# Patient Record
Sex: Male | Born: 1970 | Race: Black or African American | Hispanic: No | Marital: Married | Smoking: Never smoker
Health system: Southern US, Community
[De-identification: ages and names within clinical notes are randomized; demographics above are authoritative.]

## PROBLEM LIST (undated history)

## (undated) DIAGNOSIS — I10 Essential (primary) hypertension: Secondary | ICD-10-CM

## (undated) HISTORY — DX: Hypercalcemia: E83.52

---

## 1998-07-11 ENCOUNTER — Emergency Department (HOSPITAL_COMMUNITY): Admission: EM | Admit: 1998-07-11 | Discharge: 1998-07-11 | Payer: Self-pay | Admitting: Emergency Medicine

## 1999-03-18 ENCOUNTER — Emergency Department (HOSPITAL_COMMUNITY): Admission: EM | Admit: 1999-03-18 | Discharge: 1999-03-18 | Payer: Self-pay | Admitting: Emergency Medicine

## 2000-12-01 ENCOUNTER — Emergency Department (HOSPITAL_COMMUNITY): Admission: EM | Admit: 2000-12-01 | Discharge: 2000-12-02 | Payer: Self-pay | Admitting: Emergency Medicine

## 2003-02-23 ENCOUNTER — Emergency Department (HOSPITAL_COMMUNITY): Admission: EM | Admit: 2003-02-23 | Discharge: 2003-02-23 | Payer: Self-pay | Admitting: Emergency Medicine

## 2003-12-20 ENCOUNTER — Emergency Department (HOSPITAL_COMMUNITY): Admission: EM | Admit: 2003-12-20 | Discharge: 2003-12-20 | Payer: Self-pay | Admitting: Emergency Medicine

## 2003-12-20 IMAGING — CR DG CHEST 2V
2 series · 2 of 2 positions shown · non-contrast
Comparison: none

CLINICAL DATA: Chest pain and dyspnea.
 TWO VIEW CHEST ? [DATE]:
 No comparison films available.

[view not recorded (1 of 2)]
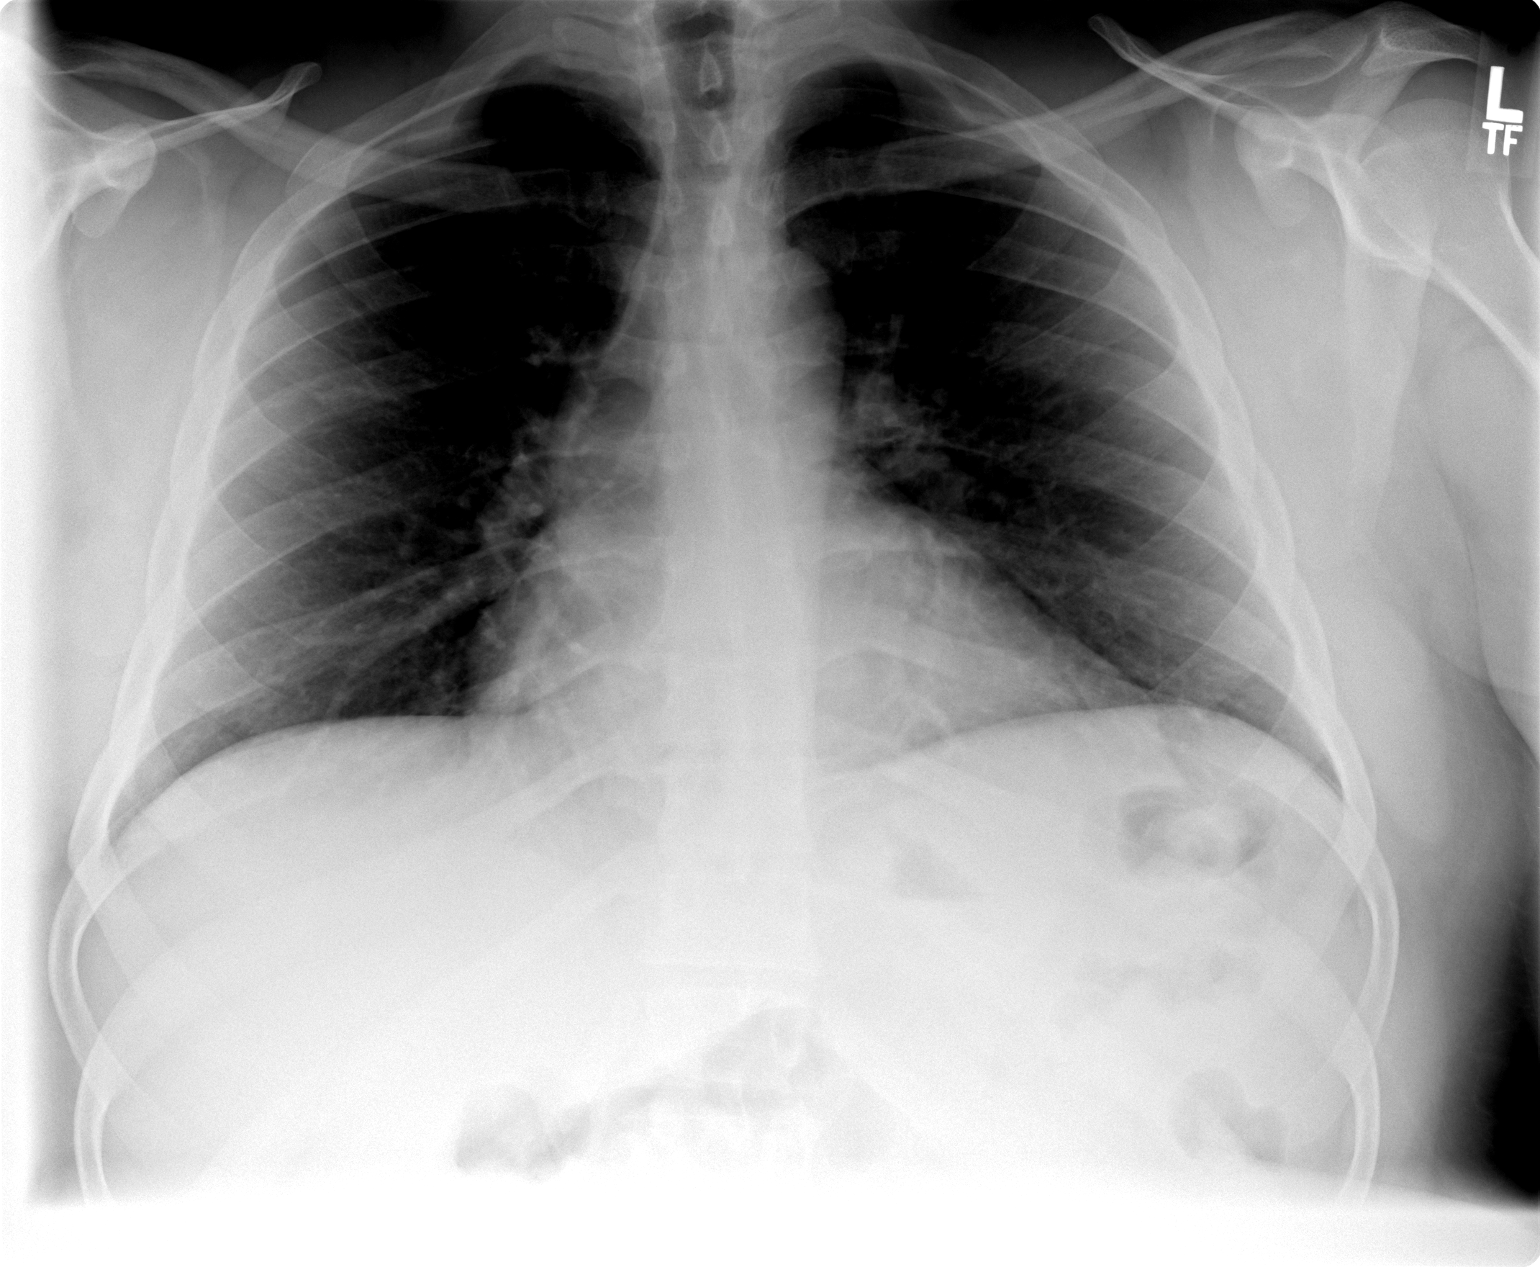

[view not recorded (2 of 2)]
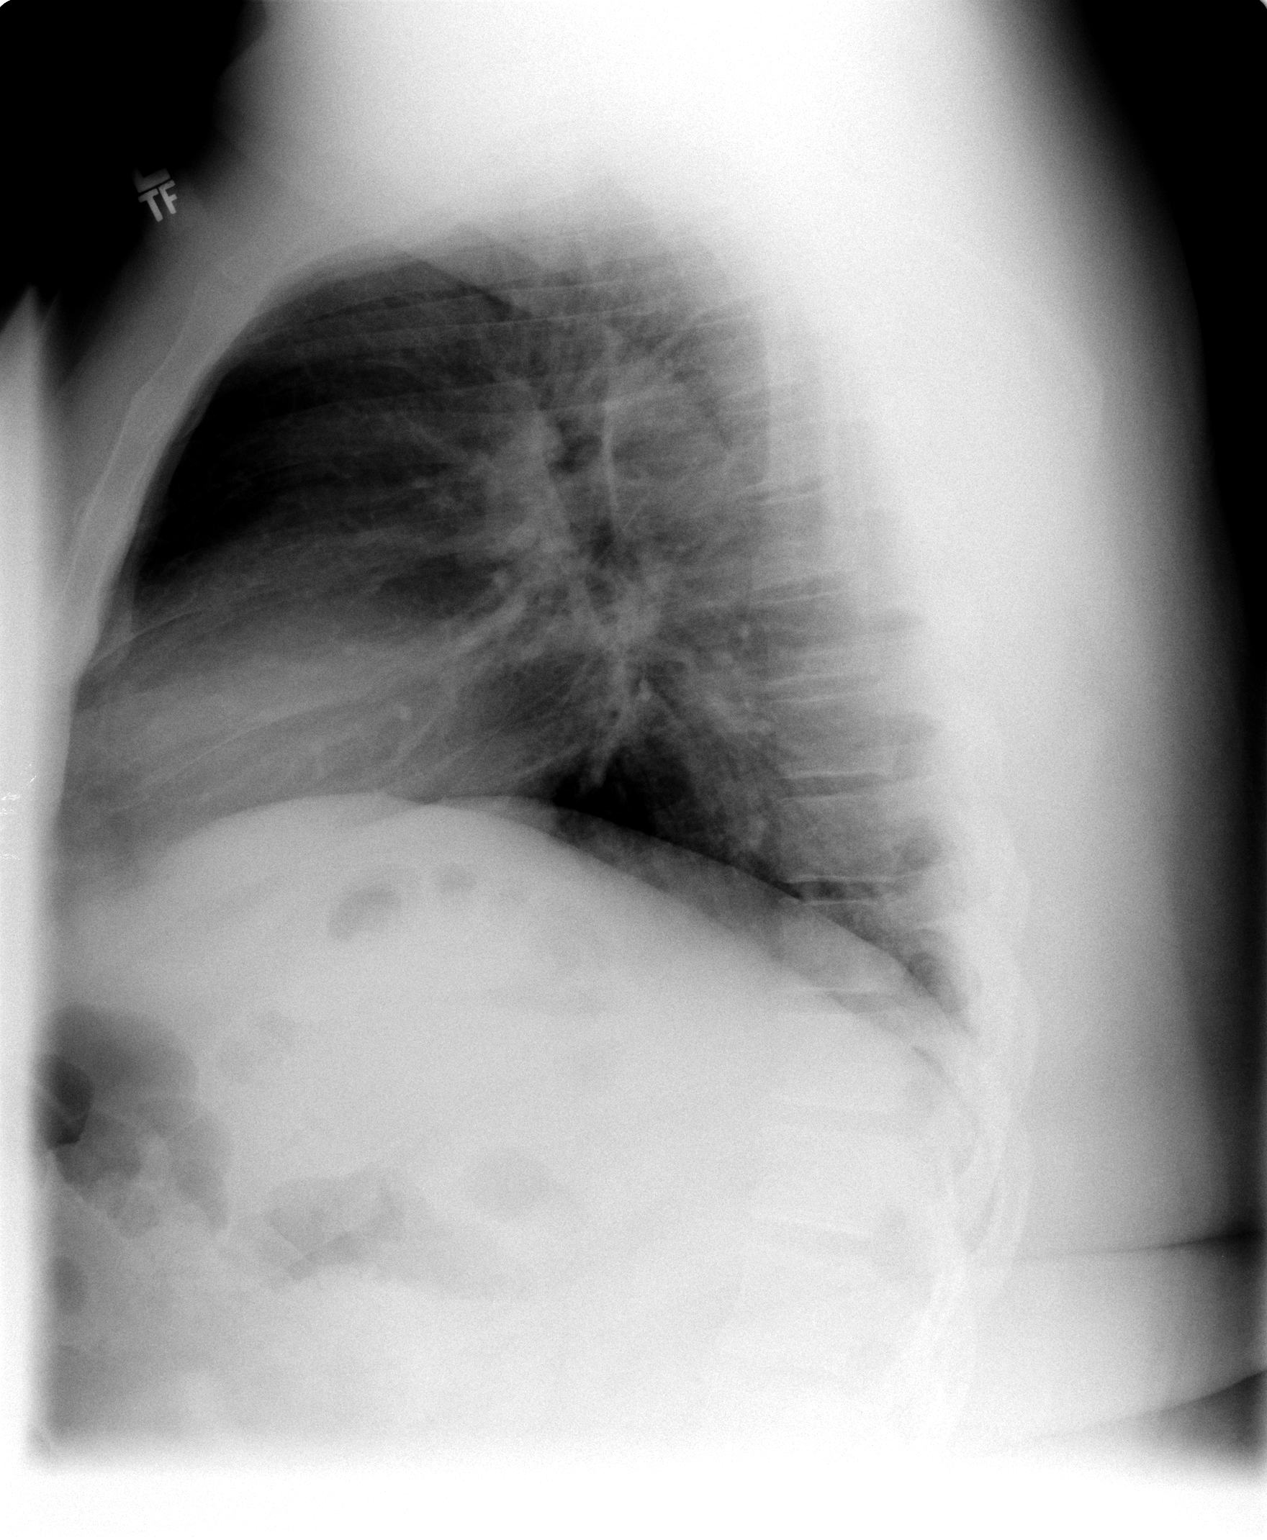

[2 of 2 positions shown; findings below may reference images not displayed]

FINDINGS: Low lung volume film.  The cardiomediastinal silhouette is unremarkable.  Mild peribronchial thickening is present without focal air space disease.  The lungs are otherwise clear.  No pleural effusions are identified.  The visualized upper abdomen and bony thorax are unremarkable.
IMPRESSION: Mild peribronchial thickening without focal air space disease.

## 2005-08-04 ENCOUNTER — Emergency Department (HOSPITAL_COMMUNITY): Admission: EM | Admit: 2005-08-04 | Discharge: 2005-08-04 | Payer: Self-pay | Admitting: Emergency Medicine

## 2006-05-17 ENCOUNTER — Encounter: Admission: RE | Admit: 2006-05-17 | Discharge: 2006-05-17 | Payer: Self-pay | Admitting: Surgery

## 2009-01-17 ENCOUNTER — Emergency Department (HOSPITAL_COMMUNITY): Admission: EM | Admit: 2009-01-17 | Discharge: 2009-01-18 | Payer: Self-pay | Admitting: Emergency Medicine

## 2010-11-10 ENCOUNTER — Emergency Department (HOSPITAL_COMMUNITY)
Admission: EM | Admit: 2010-11-10 | Discharge: 2010-11-10 | Disposition: A | Discharge status: Home / Self Care | Payer: BC Managed Care – PPO | Attending: Emergency Medicine | Admitting: Emergency Medicine

## 2010-11-10 ENCOUNTER — Encounter: Payer: Self-pay | Admitting: Emergency Medicine

## 2010-11-10 DIAGNOSIS — Y92838 Other recreation area as the place of occurrence of the external cause: Secondary | ICD-10-CM | POA: Insufficient documentation

## 2010-11-10 DIAGNOSIS — X500XXA Overexertion from strenuous movement or load, initial encounter: Secondary | ICD-10-CM | POA: Insufficient documentation

## 2010-11-10 DIAGNOSIS — I10 Essential (primary) hypertension: Secondary | ICD-10-CM | POA: Insufficient documentation

## 2010-11-10 DIAGNOSIS — E669 Obesity, unspecified: Secondary | ICD-10-CM | POA: Insufficient documentation

## 2010-11-10 DIAGNOSIS — M549 Dorsalgia, unspecified: Secondary | ICD-10-CM | POA: Insufficient documentation

## 2010-11-10 DIAGNOSIS — IMO0002 Reserved for concepts with insufficient information to code with codable children: Secondary | ICD-10-CM | POA: Insufficient documentation

## 2010-11-10 DIAGNOSIS — Y9239 Other specified sports and athletic area as the place of occurrence of the external cause: Secondary | ICD-10-CM | POA: Insufficient documentation

## 2010-11-10 HISTORY — DX: Essential (primary) hypertension: I10

## 2010-11-10 MED ORDER — KETOROLAC TROMETHAMINE 60 MG/2ML IM SOLN
60.0000 mg | Freq: Once | INTRAMUSCULAR | Status: AC
  Administered 2010-11-10: 60 mg via INTRAMUSCULAR
  Filled 2010-11-10: qty 2

## 2010-11-10 MED ORDER — IBUPROFEN 800 MG PO TABS: 800.0000 mg | ORAL_TABLET | Freq: Three times a day (TID) | ORAL | Status: AC

## 2010-11-10 NOTE — ED Notes (Signed)
Patient was playing basketball at the [facility] yesterday and went up for a rebound; states when he came down it felt a pain; like he jarred himself.

## 2010-11-10 NOTE — ED Provider Notes (Signed)
History     CSN: [id number] Arrival date & time: 11/10/2010  4:12 AM  Chief Complaint  Patient presents with  . Back Pain    (Consider location/radiation/quality/duration/timing/severity/associated sxs/prior treatment) HPI Comments: Seen [phone]  Patient is a 40 y.o. male presenting with back pain. The history is provided by the patient.  Back Pain  This is a new problem. The current episode started 12 to 24 hours ago (Patient was playing basketball yesterday and went up for a lay up. Twisted as he went up for the ball and now has back pain on the right side). The problem occurs constantly. The problem has been gradually worsening. The pain is associated with twisting. The pain is present in the lumbar spine. The quality of the pain is described as aching and shooting. The pain does not radiate. The pain is at a severity of 7/10. The pain is moderate. The symptoms are aggravated by bending, twisting and certain positions. Treatments tried: he was given an oxycodone by his mother. The treatment provided no relief. Risk factors include obesity and lack of exercise (patient is overweight and is normally not very active).    Past Medical History  Diagnosis Date  . Hypertension     History reviewed. No pertinent past surgical history.  No family history on file.  History  Substance Use Topics  . Smoking status: Never Smoker   . Smokeless tobacco: Not on file  . Alcohol Use: No      Review of Systems  Musculoskeletal: Positive for back pain.  All other systems reviewed and are negative.    Allergies  Review of patient's allergies indicates no known allergies.  Home Medications   Current Outpatient Rx  Name Route Sig Dispense Refill  . NEBIVOLOL HCL 10 MG PO TABS Oral Take 10 mg by mouth daily.      Marland Kitchen OLMESARTAN-AMLODIPINE-HCTZ 40-10-25 MG PO TABS Oral Take 1 tablet by mouth daily.        BP 153/76  Pulse 57  Temp(Src) 97.8 F (36.6 C) (Oral)  Resp 20  Ht 6' (1.829 m)   Wt 280 lb (127.007 kg)  BMI 37.97 kg/m2  SpO2 99%  Physical Exam  Nursing note and vitals reviewed. Constitutional: He is oriented to person, place, and time. He appears well-developed and well-nourished.  HENT:  Head: Normocephalic and atraumatic.  Mouth/Throat: Oropharynx is clear and moist.  Eyes: EOM are normal.  Neck: Normal range of motion. Neck supple.  Cardiovascular: Normal rate, normal heart sounds and intact distal pulses.   Pulmonary/Chest: Effort normal and breath sounds normal.  Abdominal: Soft.       obese  Musculoskeletal:       Spine non tender to percussion.Tenderness to palpation over lumbar paraspinal muscles on the right. Able to bend over toward his toes. Unable to bend laterally due to discomfort.No focal weakness.   Neurological: He is alert and oriented to person, place, and time. He has normal reflexes.  Skin: Skin is warm and dry.    ED Course  Procedures (including critical care time)  Patient with muscle strain injury to back.Given toredol with some relief. Pt stable in ED with no significant deterioration in condition. MDM Reviewed: nursing note and vitals          Nicoletta Dress. Colon Branch, MD 11/10/10 [phone]

## 2014-01-13 ENCOUNTER — Ambulatory Visit: Payer: BC Managed Care – PPO | Admitting: Dietician

## 2014-08-08 ENCOUNTER — Encounter: Payer: Self-pay | Admitting: Endocrinology

## 2015-12-07 DIAGNOSIS — Z23 Encounter for immunization: Secondary | ICD-10-CM | POA: Diagnosis not present

## 2015-12-07 DIAGNOSIS — I10 Essential (primary) hypertension: Secondary | ICD-10-CM | POA: Diagnosis not present

## 2016-01-07 DIAGNOSIS — I10 Essential (primary) hypertension: Secondary | ICD-10-CM | POA: Diagnosis not present

## 2016-01-14 ENCOUNTER — Ambulatory Visit: Payer: Self-pay | Admitting: Endocrinology

## 2016-02-07 NOTE — Progress Notes (Signed)
Subjective:    Patient ID: Nathan Simpson, male    DOB: Sep 30, 1970, 46 y.o.   MRN: [id number]  HPI Pt is referred by Dr Barbaraann Barthel, for hypercalcemia.  Pt was noted to have moderate hypercalcemia in 1998. He says this was due to a parathyroid problem.  He has never had osteoporosis, urolithiasis, thyroid probs, sarcoidosis, cancer, PUD, pancreatitis, depression, or bony fracture.  He takes a multivitamin, but no specific vit-A or -D supplement.  Pt denies taking antacids or Li++.  HCTZ was stopped in Dec of 2017.  He has slight swelling of the legs, but no assoc sob.  Past Medical History:  Diagnosis Date  . Hypercalcemia   . Hypertension     No past surgical history on file.  Social History   Social History  . Marital status: Single    Spouse name: N/A  . Number of children: N/A  . Years of education: N/A   Occupational History  . Not on file.   Social History Main Topics  . Smoking status: Never Smoker  . Smokeless tobacco: Not on file  . Alcohol use No  . Drug use: No  . Sexual activity: Not on file   Other Topics Concern  . Not on file   Social History Narrative  . No narrative on file    No current outpatient prescriptions on file prior to visit.   No current facility-administered medications on file prior to visit.     No Known Allergies  Family History  Problem Relation Age of Onset  . Hypercalcemia Neg Hx     BP 132/86   Pulse (!) 57   Ht 5' 11.5" (1.816 m)   Wt 281 lb (127.5 kg)   SpO2 98%   BMI 38.65 kg/m     Review of Systems Denies weight loss, visual loss, cough, diarrhea, sore throat, chest pain, hematuria, rash, depression, numbness, and back pain.  He has frequent urination.      Objective:   Physical Exam VS: see vs page.  GEN: no distress.  HEAD: head: no deformity.  eyes: no periorbital swelling, no proptosis.   external nose and ears are normal.  mouth: no lesion seen.  NECK: supple, thyroid is not enlarged CHEST WALL: no  deformity.  No kyphosis.  LUNGS: clear to auscultation CV: reg rate and rhythm, no murmur.  ABD: abdomen is soft, nontender.  no hepatosplenomegaly.  not distended.  no hernia.  MUSCULOSKELETAL: muscle bulk and strength are grossly normal.  no obvious joint swelling.  gait is normal and steady EXTEMITIES: trace bilat leg edema.    PULSES: no carotid bruit.   NEURO:  cn 2-12 grossly intact.   readily moves all 4's.  sensation is intact to touch on all fours.   SKIN:  Normal texture and temperature.  No rash or suspicious lesion is visible.   NODES:  None palpable at the neck.   PSYCH: alert, well-oriented.  Does not appear anxious nor depressed.    I have reviewed outside records, and summarized: Pt was noted to have elevated calcium, and referred here.  Main problem was HTN.  It was well-controlled.  The HCTZ was change to lasix.    outside test results are reviewed: Ca++=11.4 PTH=normal    Assessment & Plan:  Hypercalcemia, new to me, uncertain etiology.  We discussed.  We'll recheck labs.   HTN: well-controlled.  Patient is advised the following: Patient Instructions  blood tests are requested for you today.  We'll  let you know about the results.  This is the next step.  Please continue the same medications.

## 2016-02-10 ENCOUNTER — Encounter: Payer: Self-pay | Admitting: Endocrinology

## 2016-02-10 ENCOUNTER — Ambulatory Visit (INDEPENDENT_AMBULATORY_CARE_PROVIDER_SITE_OTHER): Payer: Self-pay | Admitting: Endocrinology

## 2016-02-10 DIAGNOSIS — I10 Essential (primary) hypertension: Secondary | ICD-10-CM

## 2016-02-10 LAB — BASIC METABOLIC PANEL
BUN: 18 mg/dL (ref 6–23)
CO2: 27 mEq/L (ref 19–32)
Calcium: 10.4 mg/dL (ref 8.4–10.5)
Chloride: 104 mEq/L (ref 96–112)
Creatinine, Ser: 1.07 mg/dL (ref 0.40–1.50)
GFR: 95.94 mL/min (ref >60.00)
Glucose, Bld: 69 mg/dL — ABNORMAL LOW (ref 70–99)
Potassium: 3.8 mEq/L (ref 3.5–5.1)
Sodium: 138 mEq/L (ref 135–145)

## 2016-02-10 LAB — VITAMIN D 25 HYDROXY (VIT D DEFICIENCY, FRACTURES): VITD: 36.41 ng/mL (ref 30.00–100.00)

## 2016-02-10 LAB — PHOSPHORUS: Phosphorus: 3 mg/dL (ref 2.3–4.6)

## 2016-02-10 LAB — MAGNESIUM: Magnesium: 2.2 mg/dL (ref 1.5–2.5)

## 2016-02-10 NOTE — Patient Instructions (Addendum)
blood tests are requested for you today.  We'll let you know about the results.  This is the next step.  Please continue the same medications.

## 2016-02-11 ENCOUNTER — Encounter: Payer: Self-pay | Admitting: Endocrinology

## 2016-02-11 LAB — PTH, INTACT AND CALCIUM
Calcium: 10.3 mg/dL (ref 8.6–10.3)
PTH: 64 pg/mL (ref 14–64)

## 2016-02-13 DIAGNOSIS — I10 Essential (primary) hypertension: Secondary | ICD-10-CM | POA: Insufficient documentation

## 2016-02-13 LAB — VITAMIN A: Vitamin A (Retinoic Acid): 45 ug/dL (ref 38–98)

## 2016-02-14 LAB — CALCIUM, URINE, 24 HOUR
Calcium, 24 hour urine: 147 mg/24 h (ref 55–300)
Calcium, Ur: 7 mg/dL

## 2016-02-15 LAB — VITAMIN D 1,25 DIHYDROXY
Vitamin D 1, 25 (OH)2 Total: 33 pg/mL (ref 18–72)
Vitamin D2 1, 25 (OH)2: <8 pg/mL
Vitamin D3 1, 25 (OH)2: 33 pg/mL

## 2016-02-15 LAB — PTH-RELATED PEPTIDE: PTH-Related Protein (PTH-RP): 9 pg/mL — ABNORMAL LOW (ref 14–27)

## 2016-06-08 DIAGNOSIS — I10 Essential (primary) hypertension: Secondary | ICD-10-CM | POA: Diagnosis not present

## 2016-06-16 ENCOUNTER — Telehealth: Payer: Self-pay | Admitting: Endocrinology

## 2016-06-16 NOTE — Telephone Encounter (Signed)
Patient stated he had lads drawn at his primary care doctor they checked his calcium it was 10.9 his PCP told him to let Dr Everardo AllEllison know what needed to be done concerning his calcium. Please advise

## 2016-06-16 NOTE — Telephone Encounter (Signed)
We are happy to see you any time, but as it is just slightly high, we can follow for now.

## 2016-06-17 NOTE — Telephone Encounter (Signed)
Left a vm with advise and requested a call back if he had any further questions

## 2016-06-29 DIAGNOSIS — I10 Essential (primary) hypertension: Secondary | ICD-10-CM | POA: Diagnosis not present

## 2016-07-25 DIAGNOSIS — I10 Essential (primary) hypertension: Secondary | ICD-10-CM | POA: Diagnosis not present

## 2016-07-26 DIAGNOSIS — I10 Essential (primary) hypertension: Secondary | ICD-10-CM | POA: Diagnosis not present

## 2016-09-16 ENCOUNTER — Ambulatory Visit: Payer: Self-pay | Admitting: Endocrinology

## 2017-04-25 DIAGNOSIS — I10 Essential (primary) hypertension: Secondary | ICD-10-CM | POA: Diagnosis not present

## 2017-05-16 DIAGNOSIS — I10 Essential (primary) hypertension: Secondary | ICD-10-CM | POA: Diagnosis not present

## 2017-07-12 DIAGNOSIS — I10 Essential (primary) hypertension: Secondary | ICD-10-CM | POA: Diagnosis not present

## 2021-05-18 DIAGNOSIS — I10 Essential (primary) hypertension: Secondary | ICD-10-CM | POA: Diagnosis not present

## 2021-05-18 DIAGNOSIS — Z0000 Encounter for general adult medical examination without abnormal findings: Secondary | ICD-10-CM | POA: Diagnosis not present

## 2021-11-16 DIAGNOSIS — Z23 Encounter for immunization: Secondary | ICD-10-CM | POA: Diagnosis not present

## 2021-11-16 DIAGNOSIS — F5101 Primary insomnia: Secondary | ICD-10-CM | POA: Diagnosis not present

## 2021-11-16 DIAGNOSIS — E049 Nontoxic goiter, unspecified: Secondary | ICD-10-CM | POA: Diagnosis not present

## 2021-11-16 DIAGNOSIS — I10 Essential (primary) hypertension: Secondary | ICD-10-CM | POA: Diagnosis not present

## 2021-11-30 ENCOUNTER — Other Ambulatory Visit: Payer: Self-pay | Admitting: Family Medicine

## 2021-11-30 DIAGNOSIS — E041 Nontoxic single thyroid nodule: Secondary | ICD-10-CM

## 2021-11-30 DIAGNOSIS — E049 Nontoxic goiter, unspecified: Secondary | ICD-10-CM

## 2021-12-01 DIAGNOSIS — Z01818 Encounter for other preprocedural examination: Secondary | ICD-10-CM | POA: Diagnosis not present

## 2021-12-13 DIAGNOSIS — Z1211 Encounter for screening for malignant neoplasm of colon: Secondary | ICD-10-CM | POA: Diagnosis not present

## 2021-12-13 DIAGNOSIS — K5730 Diverticulosis of large intestine without perforation or abscess without bleeding: Secondary | ICD-10-CM | POA: Diagnosis not present

## 2021-12-13 DIAGNOSIS — K635 Polyp of colon: Secondary | ICD-10-CM | POA: Diagnosis not present

## 2021-12-13 DIAGNOSIS — K648 Other hemorrhoids: Secondary | ICD-10-CM | POA: Diagnosis not present

## 2021-12-13 DIAGNOSIS — D124 Benign neoplasm of descending colon: Secondary | ICD-10-CM | POA: Diagnosis not present

## 2021-12-16 ENCOUNTER — Ambulatory Visit
Admission: RE | Admit: 2021-12-16 | Discharge: 2021-12-16 | Disposition: A | Discharge status: Home / Self Care | Payer: BC Managed Care – PPO | Source: Ambulatory Visit | Attending: Family Medicine | Admitting: Family Medicine

## 2021-12-16 DIAGNOSIS — E041 Nontoxic single thyroid nodule: Secondary | ICD-10-CM | POA: Diagnosis not present

## 2021-12-16 DIAGNOSIS — E049 Nontoxic goiter, unspecified: Secondary | ICD-10-CM

## 2021-12-28 DIAGNOSIS — E7800 Pure hypercholesterolemia, unspecified: Secondary | ICD-10-CM | POA: Diagnosis not present

## 2021-12-28 DIAGNOSIS — E210 Primary hyperparathyroidism: Secondary | ICD-10-CM | POA: Diagnosis not present

## 2021-12-28 DIAGNOSIS — I10 Essential (primary) hypertension: Secondary | ICD-10-CM | POA: Diagnosis not present

## 2021-12-29 ENCOUNTER — Other Ambulatory Visit (HOSPITAL_COMMUNITY): Payer: Self-pay | Admitting: Endocrinology

## 2021-12-29 DIAGNOSIS — E210 Primary hyperparathyroidism: Secondary | ICD-10-CM

## 2021-12-31 ENCOUNTER — Other Ambulatory Visit (HOSPITAL_COMMUNITY): Payer: Self-pay | Admitting: Endocrinology

## 2021-12-31 DIAGNOSIS — E210 Primary hyperparathyroidism: Secondary | ICD-10-CM

## 2022-01-13 ENCOUNTER — Encounter (HOSPITAL_COMMUNITY)
Admission: RE | Admit: 2022-01-13 | Discharge: 2022-01-13 | Disposition: A | Discharge status: Home / Self Care | Payer: BC Managed Care – PPO | Source: Ambulatory Visit | Attending: Endocrinology | Admitting: Endocrinology

## 2022-01-13 ENCOUNTER — Encounter (HOSPITAL_COMMUNITY): Payer: Self-pay

## 2022-01-13 DIAGNOSIS — E210 Primary hyperparathyroidism: Secondary | ICD-10-CM | POA: Insufficient documentation

## 2022-01-13 MED ORDER — TECHNETIUM TC 99M SESTAMIBI - CARDIOLITE
25.0000 | Freq: Once | INTRAVENOUS | Status: AC | PRN
  Administered 2022-01-13: 25.6 via INTRAVENOUS

## 2022-02-27 DIAGNOSIS — Z20822 Contact with and (suspected) exposure to covid-19: Secondary | ICD-10-CM | POA: Diagnosis not present

## 2022-02-27 DIAGNOSIS — R059 Cough, unspecified: Secondary | ICD-10-CM | POA: Diagnosis not present

## 2022-02-27 DIAGNOSIS — R0602 Shortness of breath: Secondary | ICD-10-CM | POA: Diagnosis not present

## 2022-05-26 DIAGNOSIS — Z23 Encounter for immunization: Secondary | ICD-10-CM | POA: Diagnosis not present

## 2022-05-26 DIAGNOSIS — F5101 Primary insomnia: Secondary | ICD-10-CM | POA: Diagnosis not present

## 2022-05-26 DIAGNOSIS — I10 Essential (primary) hypertension: Secondary | ICD-10-CM | POA: Diagnosis not present

## 2022-05-26 DIAGNOSIS — N529 Male erectile dysfunction, unspecified: Secondary | ICD-10-CM | POA: Diagnosis not present

## 2022-05-26 DIAGNOSIS — Z0000 Encounter for general adult medical examination without abnormal findings: Secondary | ICD-10-CM | POA: Diagnosis not present

## 2022-05-26 DIAGNOSIS — Z79899 Other long term (current) drug therapy: Secondary | ICD-10-CM | POA: Diagnosis not present

## 2022-05-26 DIAGNOSIS — E782 Mixed hyperlipidemia: Secondary | ICD-10-CM | POA: Diagnosis not present

## 2022-07-20 DIAGNOSIS — I10 Essential (primary) hypertension: Secondary | ICD-10-CM | POA: Diagnosis not present

## 2022-07-20 DIAGNOSIS — G4719 Other hypersomnia: Secondary | ICD-10-CM | POA: Diagnosis not present

## 2023-04-19 ENCOUNTER — Encounter: Payer: Self-pay | Admitting: Family Medicine

## 2023-04-19 ENCOUNTER — Other Ambulatory Visit: Payer: Self-pay | Admitting: Family Medicine

## 2023-04-19 ENCOUNTER — Ambulatory Visit: Payer: 59 | Admitting: Family Medicine

## 2023-04-19 VITALS — BP 160/120 | HR 66 | Temp 98.5°F | Ht 71.5 in | Wt 294.4 lb

## 2023-04-19 DIAGNOSIS — Z6841 Body Mass Index (BMI) 40.0 and over, adult: Secondary | ICD-10-CM

## 2023-04-19 DIAGNOSIS — N529 Male erectile dysfunction, unspecified: Secondary | ICD-10-CM

## 2023-04-19 DIAGNOSIS — I10 Essential (primary) hypertension: Secondary | ICD-10-CM | POA: Diagnosis not present

## 2023-04-19 DIAGNOSIS — R0683 Snoring: Secondary | ICD-10-CM | POA: Diagnosis not present

## 2023-04-19 MED ORDER — SILDENAFIL CITRATE 20 MG PO TABS: 20.0000 mg | ORAL_TABLET | ORAL | 1 refills | Status: DC | PRN

## 2023-04-19 MED ORDER — LISINOPRIL 40 MG PO TABS: 40.0000 mg | ORAL_TABLET | Freq: Every day | ORAL | 1 refills | Status: DC

## 2023-04-19 MED ORDER — AMLODIPINE BESYLATE 10 MG PO TABS
10.0000 mg | ORAL_TABLET | Freq: Every day | ORAL | 1 refills | Status: DC
Start: 2023-04-19 — End: 2023-11-13

## 2023-04-19 MED ORDER — METOPROLOL SUCCINATE ER 100 MG PO TB24: 100.0000 mg | ORAL_TABLET | Freq: Every day | ORAL | 1 refills | Status: DC

## 2023-04-19 NOTE — Telephone Encounter (Signed)
 Copied from CRM [id number]. Topic: Clinical - Medication Refill >> Apr 19, 2023  2:39 PM Elle L wrote: Most Recent Primary Care Visit:  Provider: Bernadette Hoit  Department: [facility]  Visit Type: NEW PT - OFFICE VISIT  Date: 04/19/2023  Medication: furosemide (LASIX) 20 MG tablet  Has the patient contacted their pharmacy? Yes (Agent: If yes, when and what did the pharmacy advise?)  Is this the correct pharmacy for this prescription? Yes  This is the patient's preferred pharmacy:   [facility] [address] [place] - [address] [place] [address] [place] [address] Phone: [phone] Fax: [phone]   Has the prescription been filled recently? No  Is the patient out of the medication? Yes  Has the patient been seen for an appointment in the last year OR does the patient have an upcoming appointment? Yes  Can we respond through MyChart? Yes  Agent: Please be advised that Rx refills may take up to 3 business days. We ask that you follow-up with your pharmacy.

## 2023-04-19 NOTE — Progress Notes (Signed)
 Patient Office Visit  Assessment & Plan:  Hypertension, unspecified type -     CBC with Differential/Platelet -     COMPLETE METABOLIC PANEL WITH GFR -     Lipid panel -     amLODIPine Besylate; Take 1 tablet (10 mg total) by mouth daily.  Dispense: 90 tablet; Refill: 1 -     Lisinopril; Take 1 tablet (40 mg total) by mouth daily.  Dispense: 90 tablet; Refill: 1 -     Metoprolol Succinate ER; Take 1 tablet (100 mg total) by mouth daily. Take with or immediately following a meal.  Dispense: 90 tablet; Refill: 1  BMI 40.0-44.9, adult (HCC) -     Hemoglobin A1c -     TSH  Snoring -     Ambulatory referral to Sleep Studies  Erectile disorder -     Sildenafil Citrate; Take 1 tablet (20 mg total) by mouth as needed.  Dispense: 10 tablet; Refill: 1    Return in about 4 weeks (around 05/17/2023), or if symptoms worsen or fail to improve, for hypertension.   Subjective:    Patient ID: Nathan Simpson, male    DOB: 1970-08-18  Age: 53 y.o. MRN: [id number]  Chief Complaint  Patient presents with   Medical Management of Chronic Issues   Establish Care    HPI Pt is here to establish medical care and to follow-up on chronic medical issues which include hypertension, snoring with concerns for OSA, obesity, ED and previous history of elevated calcium. HTN-Pt ran out of his meds for 3 days. Pt had been using antihypertensive medication without difficulty.  Denies associated signs and symptoms including chest pain, shortness of breath, cough headache, peripheral swelling cramps spasms and palpitations.  Voices understanding of the potential for interference with blood pressure control with substances including high sodium intake, decongestions, herbal supplements weight loss supplements nutritional supplements.  Blood pressures at home are less than 140/90.  BP is up today because he ran out of meds.  Snoring- patient has been having snoring and apneic episodes per wife. Pt is concerned because  he is tired in the AM and has not been checked for OSA. Pt has brother who has OSA and uses CPAP.  Morbid Obesity- pt has been using compounded Wegovy and has lost weight since Feb. Pt has been eating more salads and working out consistently.  Elevated calcium- pt has had extensive evaluation re his parathyroid gland and thyroid and everything turned out OK.  Last time calcium was checked it was in a normal range.  Health Maintenance- pt is UTD colonoscopy, tetanus and got his first Shingrix.  Pt not sure why he was taking Lasix, does not have LE edema. Pt has not been taking it for a while ED- pt takes Revatio prn but not regularly. Pt does not have hx of pulmonary hypertension   The ASCVD Risk score (Arnett DK, et al., 2019) failed to calculate for the following reasons:   Cannot find a previous HDL lab   Cannot find a previous total cholesterol lab The 10-year ASCVD risk score (Arnett DK, et al., 2019) is: 14.2%   Values used to calculate the score:     Age: 76 years     Sex: Male     Is Non-Hispanic African American: Yes     Diabetic: No     Tobacco smoker: No     Systolic Blood Pressure: 160 mmHg     Is BP treated: Yes  HDL Cholesterol: 52 mg/dL     Total Cholesterol: 198 mg/dL  Past Medical History:  Diagnosis Date   Hypercalcemia    Hypertension    History reviewed. No pertinent surgical history. Social History   Tobacco Use   Smoking status: Never   Smokeless tobacco: Never  Vaping Use   Vaping status: Never Used  Substance Use Topics   Alcohol use: No   Drug use: No   Family History  Problem Relation Age of Onset   Diabetes type II Mother    Lung cancer Father    Osteoarthritis Brother    Sleep apnea Brother    Hypercalcemia Neg Hx    No Known Allergies  ROS    Objective:    BP (!) 160/120   Pulse 66   Temp 98.5 F (36.9 C)   Ht 5' 11.5" (1.816 m)   Wt 294 lb 6 oz (133.5 kg)   SpO2 99%   BMI 40.48 kg/m  BP Readings from Last 3 Encounters:   04/19/23 (!) 160/120  02/10/16 132/86  11/10/10 153/76  Weight 304 pounds in January  Wt Readings from Last 3 Encounters:  04/19/23 294 lb 6 oz (133.5 kg)  02/10/16 281 lb (127.5 kg)  11/10/10 280 lb (127 kg)    Physical Exam Vitals and nursing note reviewed.  Constitutional:      Appearance: Normal appearance.  HENT:     Head: Normocephalic.     Right Ear: Tympanic membrane, ear canal and external ear normal.     Left Ear: Tympanic membrane, ear canal and external ear normal.  Eyes:     Extraocular Movements: Extraocular movements intact.     Conjunctiva/sclera: Conjunctivae normal.     Pupils: Pupils are equal, round, and reactive to light.  Cardiovascular:     Rate and Rhythm: Normal rate and regular rhythm.     Heart sounds: Normal heart sounds.  Pulmonary:     Effort: Pulmonary effort is normal.     Breath sounds: Normal breath sounds.  Musculoskeletal:     Right lower leg: No edema.     Left lower leg: No edema.  Neurological:     General: No focal deficit present.     Mental Status: He is alert and oriented to person, place, and time.  Psychiatric:        Mood and Affect: Mood normal.        Behavior: Behavior normal.        Thought Content: Thought content normal.        Judgment: Judgment normal.      No results found for any visits on 04/19/23.

## 2023-04-20 LAB — COMPLETE METABOLIC PANEL WITH GFR
AG Ratio: 1.7 (calc) (ref 1.0–2.5)
ALT: 21 U/L (ref 9–46)
AST: 17 U/L (ref 10–35)
Albumin: 4.7 g/dL (ref 3.6–5.1)
Alkaline phosphatase (APISO): 116 U/L (ref 35–144)
BUN: 11 mg/dL (ref 7–25)
CO2: 25 mmol/L (ref 20–32)
Calcium: 10.4 mg/dL — ABNORMAL HIGH (ref 8.6–10.3)
Chloride: 103 mmol/L (ref 98–110)
Creat: 0.89 mg/dL (ref 0.70–1.30)
Globulin: 2.7 g/dL (ref 1.9–3.7)
Glucose, Bld: 86 mg/dL (ref 65–99)
Potassium: 4.1 mmol/L (ref 3.5–5.3)
Sodium: 136 mmol/L (ref 135–146)
Total Bilirubin: 0.5 mg/dL (ref 0.2–1.2)
Total Protein: 7.4 g/dL (ref 6.1–8.1)

## 2023-04-20 LAB — LIPID PANEL
Cholesterol: 198 mg/dL (ref <200)
HDL: 52 mg/dL (ref >=40)
LDL Cholesterol (Calc): 130 mg/dL — ABNORMAL HIGH
Non-HDL Cholesterol (Calc): 146 mg/dL — ABNORMAL HIGH (ref <130)
Total CHOL/HDL Ratio: 3.8 (calc) (ref <5.0)
Triglycerides: 65 mg/dL (ref <150)

## 2023-04-20 LAB — TSH: TSH: 1.97 m[IU]/L (ref 0.40–4.50)

## 2023-04-20 LAB — HEMOGLOBIN A1C
Hgb A1c MFr Bld: 5.8 %{Hb} — ABNORMAL HIGH (ref <5.7)
Mean Plasma Glucose: 120 mg/dL
eAG (mmol/L): 6.6 mmol/L

## 2023-04-20 LAB — CBC WITH DIFFERENTIAL/PLATELET
Absolute Lymphocytes: 3424 {cells}/uL (ref 850–3900)
Absolute Monocytes: 462 {cells}/uL (ref 200–950)
Basophils Absolute: 7 {cells}/uL (ref 0–200)
Basophils Relative: 0.1 %
Eosinophils Absolute: 161 {cells}/uL (ref 15–500)
Eosinophils Relative: 2.4 %
HCT: 46.9 % (ref 38.5–50.0)
Hemoglobin: 15 g/dL (ref 13.2–17.1)
MCH: 25.1 pg — ABNORMAL LOW (ref 27.0–33.0)
MCHC: 32 g/dL (ref 32.0–36.0)
MCV: 78.6 fL — ABNORMAL LOW (ref 80.0–100.0)
MPV: 9.8 fL (ref 7.5–12.5)
Monocytes Relative: 6.9 %
Neutro Abs: 2647 {cells}/uL (ref 1500–7800)
Neutrophils Relative %: 39.5 %
Platelets: 299 10*3/uL (ref 140–400)
RBC: 5.97 10*6/uL — ABNORMAL HIGH (ref 4.20–5.80)
RDW: 14.2 % (ref 11.0–15.0)
Total Lymphocyte: 51.1 %
WBC: 6.7 10*3/uL (ref 3.8–10.8)

## 2023-04-20 NOTE — Telephone Encounter (Signed)
 Requested medication (s) are due for refill today: yes  Requested medication (s) are on the active medication list: yes  Last refill:  02/10/16  Future visit scheduled: yes  Notes to clinic:  Unable to refill per protocol, last refill by another provider.      Requested Prescriptions  Pending Prescriptions Disp Refills   furosemide (LASIX) 20 MG tablet 30 tablet     Sig: Take 1 tablet (20 mg total) by mouth.     Cardiovascular:  Diuretics - Loop Failed - 04/20/2023  3:24 PM      Failed - Ca in normal range and within 180 days    Calcium  Date Value Ref Range Status  04/19/2023 10.4 (H) 8.6 - 10.3 mg/dL Final         Failed - Mg Level in normal range and within 180 days    Magnesium  Date Value Ref Range Status  02/10/2016 2.2 1.5 - 2.5 mg/dL Final         Failed - Last BP in normal range    BP Readings from Last 1 Encounters:  04/19/23 (!) 160/120         Failed - Valid encounter within last 6 months    Recent Outpatient Visits   None            Passed - K in normal range and within 180 days    Potassium  Date Value Ref Range Status  04/19/2023 4.1 3.5 - 5.3 mmol/L Final         Passed - Na in normal range and within 180 days    Sodium  Date Value Ref Range Status  04/19/2023 136 135 - 146 mmol/L Final         Passed - Cr in normal range and within 180 days    Creat  Date Value Ref Range Status  04/19/2023 0.89 0.70 - 1.30 mg/dL Final         Passed - Cl in normal range and within 180 days    Chloride  Date Value Ref Range Status  04/19/2023 103 98 - 110 mmol/L Final

## 2023-04-21 ENCOUNTER — Encounter: Payer: Self-pay | Admitting: Family Medicine

## 2023-05-17 ENCOUNTER — Ambulatory Visit: Admitting: Family Medicine

## 2023-05-17 ENCOUNTER — Encounter: Payer: Self-pay | Admitting: Family Medicine

## 2023-05-17 VITALS — BP 136/86 | HR 76 | Temp 98.5°F | Ht 71.5 in | Wt 290.0 lb

## 2023-05-17 DIAGNOSIS — J302 Other seasonal allergic rhinitis: Secondary | ICD-10-CM

## 2023-05-17 DIAGNOSIS — Z6839 Body mass index (BMI) 39.0-39.9, adult: Secondary | ICD-10-CM | POA: Diagnosis not present

## 2023-05-17 DIAGNOSIS — I10 Essential (primary) hypertension: Secondary | ICD-10-CM

## 2023-05-17 DIAGNOSIS — R0683 Snoring: Secondary | ICD-10-CM

## 2023-05-17 DIAGNOSIS — R7309 Other abnormal glucose: Secondary | ICD-10-CM | POA: Insufficient documentation

## 2023-05-17 DIAGNOSIS — E7800 Pure hypercholesterolemia, unspecified: Secondary | ICD-10-CM

## 2023-05-17 MED ORDER — LISINOPRIL 40 MG PO TABS
40.0000 mg | ORAL_TABLET | Freq: Every day | ORAL | 1 refills | Status: AC
Start: 2023-05-17 — End: ?

## 2023-05-17 NOTE — Progress Notes (Signed)
 Patient Office Visit  Assessment & Plan:  Snoring  Hypertension, unspecified type -     Lisinopril; Take 1 tablet (40 mg total) by mouth daily.  Dispense: 90 tablet; Refill: 1  BMI 39.0-39.9,adult  Seasonal allergies  Elevated glucose  Elevated LDL cholesterol level   Continue current blood pressure medications.  Will stay off of the Lasix for now.  If he does start to develop any swelling he will let us know.  Continue healthy diet consistent exercise.  Return in 3 months or sooner if necessary. Return in about 3 months (around 08/16/2023), or if symptoms worsen or fail to improve.   Subjective:    Patient ID: Nathan Simpson, male    DOB: January 23, 1971  Age: 53 y.o. MRN: [id number]  Chief Complaint  Patient presents with   Medical Management of Chronic Issues    HPI HTN-using antihypertensive medication without difficulty.  Denies associated signs and symptoms including chest pain, shortness of breath, cough headache, peripheral swelling cramps spasms and palpitations.  Voices understanding of the potential for interference with blood pressure control with substances including high sodium intake, decongestions, herbal supplements weight loss supplements nutritional supplements.  Blood pressures at home are less than 140/90.  Taking lisinopril 40 mg once a day and amlodipine 10 mg once a day and Toprol-XL 100 mg once a day..  Patient was also taking Lasix 20 mg once a day for the last couple years but does not know exactly why. Elevated LDL- pt not taking chol medication as recommended. We can discuss this next time. Pt is eating healthier and working out 6 days per week.  Prediabetes-previously controlled.  Denies diarrhea, peripheral swelling, hypoglycemia, excessive thirst, excessive urination, visual fluctuation, and fatigue.  Pt is doing generic Ozempic. Pt has been losing weight due to med and eating healthier overall. Pt working out too.  Pt not eating any fried foods or  sweets Allergic Rhinitis:  Patient's symptoms include cough, headaches, nasal congestion, postnasal drip, sinus pressure and sneezing. These symptoms are seasonal. Current triggers include exposure to pollens. The patient has been suffering from these symptoms for approximately several months. Wife bought some OTC Zyrtec so will start taking this. Pt not having SOB or wheezing.  Snoring/?OSA-patient does have an upcoming sleep study in the next week or so. The 10-year ASCVD risk score (Arnett DK, et al., 2019) is: 10.6%  Past Medical History:  Diagnosis Date   Hypercalcemia    Hypertension    History reviewed. No pertinent surgical history. Social History   Tobacco Use   Smoking status: Never   Smokeless tobacco: Never  Vaping Use   Vaping status: Never Used  Substance Use Topics   Alcohol use: No   Drug use: No   Family History  Problem Relation Age of Onset   Diabetes type II Mother    Lung cancer Father    Osteoarthritis Brother    Sleep apnea Brother    Hypercalcemia Neg Hx    No Known Allergies  ROS    Objective:    BP 136/86   Pulse 76   Temp 98.5 F (36.9 C)   Ht 5' 11.5" (1.816 m)   Wt 290 lb (131.5 kg)   SpO2 98%   BMI 39.88 kg/m  BP Readings from Last 3 Encounters:  05/17/23 136/86  04/19/23 (!) 160/120  02/10/16 132/86   Wt Readings from Last 3 Encounters:  05/17/23 290 lb (131.5 kg)  04/19/23 294 lb 6 oz (133.5 kg)  02/10/16 281 lb (127.5 kg)    Physical Exam Vitals and nursing note reviewed.  Constitutional:      General: He is not in acute distress.    Appearance: Normal appearance.  HENT:     Head: Normocephalic.     Right Ear: Tympanic membrane, ear canal and external ear normal.     Left Ear: Tympanic membrane, ear canal and external ear normal.  Eyes:     Extraocular Movements: Extraocular movements intact.     Conjunctiva/sclera: Conjunctivae normal.     Pupils: Pupils are equal, round, and reactive to light.  Cardiovascular:      Rate and Rhythm: Normal rate and regular rhythm.     Heart sounds: Normal heart sounds.  Pulmonary:     Effort: Pulmonary effort is normal.     Breath sounds: Normal breath sounds. No wheezing.  Musculoskeletal:     Right lower leg: No edema.     Left lower leg: No edema.  Neurological:     General: No focal deficit present.     Mental Status: He is alert and oriented to person, place, and time.  Psychiatric:        Mood and Affect: Mood normal.        Behavior: Behavior normal.      No results found for any visits on 05/17/23.

## 2023-08-16 ENCOUNTER — Ambulatory Visit: Admitting: Family Medicine

## 2023-11-13 ENCOUNTER — Other Ambulatory Visit: Payer: Self-pay | Admitting: Family Medicine

## 2023-11-13 DIAGNOSIS — I10 Essential (primary) hypertension: Secondary | ICD-10-CM

## 2023-12-28 ENCOUNTER — Other Ambulatory Visit: Payer: Self-pay | Admitting: Family Medicine

## 2023-12-28 DIAGNOSIS — N529 Male erectile dysfunction, unspecified: Secondary | ICD-10-CM

## 2024-02-18 ENCOUNTER — Other Ambulatory Visit: Payer: Self-pay | Admitting: Family Medicine

## 2024-02-18 DIAGNOSIS — I10 Essential (primary) hypertension: Secondary | ICD-10-CM

## 2024-02-22 ENCOUNTER — Other Ambulatory Visit: Payer: Self-pay | Admitting: Family Medicine

## 2024-02-22 DIAGNOSIS — I10 Essential (primary) hypertension: Secondary | ICD-10-CM
